# Patient Record
Sex: Male | Born: 1985
Health system: Southern US, Community
[De-identification: ages and names within clinical notes are randomized; demographics above are authoritative.]

## PROBLEM LIST (undated history)

## (undated) HISTORY — PX: OTHER SURGICAL HISTORY: SHX169

---

## 2019-12-27 DIAGNOSIS — Z03818 Encounter for observation for suspected exposure to other biological agents ruled out: Secondary | ICD-10-CM | POA: Diagnosis not present

## 2020-01-18 DIAGNOSIS — Z03818 Encounter for observation for suspected exposure to other biological agents ruled out: Secondary | ICD-10-CM | POA: Diagnosis not present

## 2020-01-26 ENCOUNTER — Ambulatory Visit
Admission: EM | Admit: 2020-01-26 | Discharge: 2020-01-26 | Disposition: A | Discharge status: Home / Self Care | Payer: BC Managed Care – PPO | Attending: Emergency Medicine | Admitting: Emergency Medicine

## 2020-01-26 ENCOUNTER — Other Ambulatory Visit: Payer: Self-pay

## 2020-01-26 DIAGNOSIS — M79671 Pain in right foot: Secondary | ICD-10-CM

## 2020-01-26 DIAGNOSIS — G5601 Carpal tunnel syndrome, right upper limb: Secondary | ICD-10-CM | POA: Diagnosis not present

## 2020-01-26 MED ORDER — PREDNISONE 10 MG (21) PO TBPK: ORAL_TABLET | Freq: Every day | ORAL | 0 refills | Status: DC

## 2020-01-26 NOTE — ED Triage Notes (Signed)
Pt states right wrist and right heel pain x 1 week and wrist pain for a longer time. Typing aggravates the wrist pain. Pt is aox4 and ambulatory.

## 2020-01-26 NOTE — Discharge Instructions (Addendum)
Heat therapy (hot compress, warm wash rag, hot showers, etc.) can help relax muscles and soothe muscle aches. Cold therapy (ice packs) can be used to help swelling both after injury and after prolonged use of areas of chronic pain/aches.  Pain medication:  500 mg Naprosyn/Aleve (naproxen) every 12 hours with food:  AVOID other NSAIDs while taking this (may have Tylenol).  Important to follow up with specialist(s) below for further evaluation/management if your symptoms persist or worsen. 

## 2020-01-26 NOTE — ED Provider Notes (Signed)
[facility]    CSN: [id number] Arrival date & time: 01/26/20  1633      History   Chief Complaint Chief Complaint  Patient presents with  . Foot Pain    since Friday  . Wrist Pain    Since Friday    HPI Jacob Mora is a 34 y.o. male  Presenting for multiple concerns. Endorsing right heel pain x1 week.  Denies injury, overuse.  No numbness or deformity.  Voicing concern for heel pain versus plantar fasciitis based on "Internet research ".  Has tried Tylenol without relief. Similarly, patient endorsing right wrist pain that has been chronic, intermittent.  Bugging him for the last few days.  No numbness, tingling.  Concern for carpal tunnel syndrome, though denies history thereof.  Does use right hand, types a lot which exacerbates this.   History reviewed. No pertinent past medical history.  There are no problems to display for this patient.   History reviewed. No pertinent surgical history.     Home Medications    Prior to Admission medications   Medication Sig Start Date End Date Taking? Authorizing Provider  predniSONE (STERAPRED UNI-PAK 21 TAB) 10 MG (21) TBPK tablet Take by mouth daily. Take steroid taper as written 01/26/20   Hall-Potvin, [place], PA-C    Family History History reviewed. No pertinent family history.  Social History Social History   Tobacco Use  . Smoking status: Never Smoker  . Smokeless tobacco: Never Used  Vaping Use  . Vaping Use: Never used  Substance Use Topics  . Alcohol use: Never  . Drug use: Never     Allergies   Patient has no allergy information on record.   Review of Systems As per HPI   Physical Exam Triage Vital Signs ED Triage Vitals  Enc Vitals Group     BP      Pulse      Resp      Temp      Temp src      SpO2      Weight      Height      Head Circumference      Peak Flow      Pain Score      Pain Loc      Pain Edu?      Excl. in GC?    No data found.  Updated Vital  Signs BP 108/72 (BP Location: Left Arm)   Pulse 75   Temp 97.8 F (36.6 C) (Oral)   Resp 20   SpO2 96%   Visual Acuity Right Eye Distance:   Left Eye Distance:   Bilateral Distance:    Right Eye Near:   Left Eye Near:    Bilateral Near:     Physical Exam Constitutional:      General: He is not in acute distress. HENT:     Head: Normocephalic and atraumatic.  Eyes:     General: No scleral icterus.    Pupils: Pupils are equal, round, and reactive to light.  Cardiovascular:     Rate and Rhythm: Normal rate.  Pulmonary:     Effort: Pulmonary effort is normal. No respiratory distress.     Breath sounds: No wheezing.  Musculoskeletal:     Comments: Right wrist not edematous as compared to left.  Mild tenderness diffusely throughout wrist.  No bony deformity.  Neurovascular intact.  Negative Tinel's, Phalen's, Finkelstein's test. Right heel with negative plantar tenderness.  Does have right heel  tenderness.  Negative Thompson test.  Skin:    Coloration: Skin is not jaundiced or pale.  Neurological:     Mental Status: He is alert and oriented to person, place, and time.      UC Treatments / Results  Labs (all labs ordered are listed, but only abnormal results are displayed) Labs Reviewed - No data to display  EKG   Radiology No results found.  Procedures Procedures (including critical care time)  Medications Ordered in UC Medications - No data to display  Initial Impression / Assessment and Plan / UC Course  I have reviewed the triage vital signs and the nursing notes.  Pertinent labs & imaging results that were available during my care of the patient were reviewed by me and considered in my medical decision making (see chart for details).     Likely heel spur and some component of carpal tunnel syndrome.  Provided Ace wrap, reviewed RICE protocol as below.  Provided specialty contact information for follow-up as needed.  Return precautions discussed, pt  verbalized understanding and is agreeable to plan. Final Clinical Impressions(s) / UC Diagnoses   Final diagnoses:  Carpal tunnel syndrome of right wrist  Pain of right heel     Discharge Instructions     Heat therapy (hot compress, warm wash rag, hot showers, etc.) can help relax muscles and soothe muscle aches. Cold therapy (ice packs) can be used to help swelling both after injury and after prolonged use of areas of chronic pain/aches.  Pain medication:  500 mg Naprosyn/Aleve (naproxen) every 12 hours with food:  AVOID other NSAIDs while taking this (may have Tylenol).  Important to follow up with specialist(s) below for further evaluation/management if your symptoms persist or worsen.    ED Prescriptions    Medication Sig Dispense Auth. Provider   predniSONE (STERAPRED UNI-PAK 21 TAB) 10 MG (21) TBPK tablet Take by mouth daily. Take steroid taper as written 21 tablet Hall-Potvin, [place], PA-C     PDMP not reviewed this encounter.   Hall-Potvin, [place], [place] 01/26/20 1738

## 2020-02-09 DIAGNOSIS — Z03818 Encounter for observation for suspected exposure to other biological agents ruled out: Secondary | ICD-10-CM | POA: Diagnosis not present

## 2020-03-14 DIAGNOSIS — Z03818 Encounter for observation for suspected exposure to other biological agents ruled out: Secondary | ICD-10-CM | POA: Diagnosis not present

## 2020-04-17 DIAGNOSIS — Z03818 Encounter for observation for suspected exposure to other biological agents ruled out: Secondary | ICD-10-CM | POA: Diagnosis not present

## 2020-04-19 DIAGNOSIS — Z20822 Contact with and (suspected) exposure to covid-19: Secondary | ICD-10-CM | POA: Diagnosis not present

## 2020-04-24 ENCOUNTER — Ambulatory Visit: Payer: BC Managed Care – PPO | Admitting: Family Medicine

## 2020-04-24 DIAGNOSIS — Z03818 Encounter for observation for suspected exposure to other biological agents ruled out: Secondary | ICD-10-CM | POA: Diagnosis not present

## 2020-04-28 DIAGNOSIS — Z23 Encounter for immunization: Secondary | ICD-10-CM | POA: Diagnosis not present

## 2020-05-13 DIAGNOSIS — G44209 Tension-type headache, unspecified, not intractable: Secondary | ICD-10-CM | POA: Diagnosis not present

## 2020-05-18 ENCOUNTER — Encounter: Payer: Self-pay | Admitting: Family Medicine

## 2020-05-18 ENCOUNTER — Ambulatory Visit (INDEPENDENT_AMBULATORY_CARE_PROVIDER_SITE_OTHER): Payer: BC Managed Care – PPO | Admitting: Family Medicine

## 2020-05-18 ENCOUNTER — Other Ambulatory Visit: Payer: Self-pay

## 2020-05-18 VITALS — BP 124/79 | HR 91 | Temp 98.7°F | Ht 68.0 in | Wt 186.4 lb

## 2020-05-18 DIAGNOSIS — N50811 Right testicular pain: Secondary | ICD-10-CM

## 2020-05-18 DIAGNOSIS — Z13220 Encounter for screening for lipoid disorders: Secondary | ICD-10-CM | POA: Diagnosis not present

## 2020-05-18 DIAGNOSIS — Z0001 Encounter for general adult medical examination with abnormal findings: Secondary | ICD-10-CM | POA: Diagnosis not present

## 2020-05-18 DIAGNOSIS — L659 Nonscarring hair loss, unspecified: Secondary | ICD-10-CM | POA: Diagnosis not present

## 2020-05-18 DIAGNOSIS — N50812 Left testicular pain: Secondary | ICD-10-CM | POA: Diagnosis not present

## 2020-05-18 NOTE — Assessment & Plan Note (Signed)
Stable on finasteride 1 mg daily.

## 2020-05-18 NOTE — Patient Instructions (Signed)
It was very nice to see you today!  We will check an ultrasound.  We will check blood work today.  I will see you back in year for your next physical.  Please come back to see me sooner if needed.  Take care, Dr Jimmey Ralph  Please try these tips to maintain a healthy lifestyle:   Eat at least 3 REAL meals and 1-2 snacks per day.  Aim for no more than 5 hours between eating.  If you eat breakfast, please do so within one hour of getting up.    Each meal should contain half fruits/vegetables, one quarter protein, and one quarter carbs (no bigger than a computer mouse)   Cut down on sweet beverages. This includes juice, soda, and sweet tea.     Drink at least 1 glass of water with each meal and aim for at least 8 glasses per day   Exercise at least 150 minutes every week.    Preventive Care 60-8 Years Old, Male Preventive care refers to lifestyle choices and visits with your health care provider that can promote health and wellness. This includes:  A yearly physical exam. This is also called an annual wellness visit.  Regular dental and eye exams.  Immunizations.  Screening for certain conditions.  Healthy lifestyle choices, such as: ? Eating a healthy diet. ? Getting regular exercise. ? Not using drugs or products that contain nicotine and tobacco. ? Limiting alcohol use. What can I expect for my preventive care visit? Physical exam Your health care provider may check your:  Height and weight. These may be used to calculate your BMI (body mass index). BMI is a measurement that tells if you are at a healthy weight.  Heart rate and blood pressure.  Body temperature.  Skin for abnormal spots. Counseling Your health care provider may ask you questions about your:  Past medical problems.  Family's medical history.  Alcohol, tobacco, and drug use.  Emotional well-being.  Home life and relationship well-being.  Sexual activity.  Diet, exercise, and sleep  habits.  Work and work Astronomer.  Access to firearms. What immunizations do I need? Vaccines are usually given at various ages, according to a schedule. Your health care provider will recommend vaccines for you based on your age, medical history, and lifestyle or other factors, such as travel or where you work.   What tests do I need? Blood tests  Lipid and cholesterol levels. These may be checked every 5 years starting at age 12.  Hepatitis C test.  Hepatitis B test. Screening  Diabetes screening. This is done by checking your blood sugar (glucose) after you have not eaten for a while (fasting).  Genital exam to check for testicular cancer or hernias.  STD (sexually transmitted disease) testing, if you are at risk. Talk with your health care provider about your test results, treatment options, and if necessary, the need for more tests.   Follow these instructions at home: Eating and drinking  Eat a healthy diet that includes fresh fruits and vegetables, whole grains, lean protein, and low-fat dairy products.  Drink enough fluid to keep your urine pale yellow.  Take vitamin and mineral supplements as recommended by your health care provider.  Do not drink alcohol if your health care provider tells you not to drink.  If you drink alcohol: ? Limit how much you have to 0-2 drinks a day. ? Be aware of how much alcohol is in your drink. In the [place] one drink  equals one 12 oz bottle of beer (355 mL), one 5 oz glass of wine (148 mL), or one 1 oz glass of hard liquor (44 mL).   Lifestyle  Take daily care of your teeth and gums. Brush your teeth every morning and night with fluoride toothpaste. Floss one time each day.  Stay active. Exercise for at least 30 minutes 5 or more days each week.  Do not use any products that contain nicotine or tobacco, such as cigarettes, e-cigarettes, and chewing tobacco. If you need help quitting, ask your health care provider.  Do not use  drugs.  If you are sexually active, practice safe sex. Use a condom or other form of protection to prevent STIs (sexually transmitted infections).  Find healthy ways to cope with stress, such as: ? Meditation, yoga, or listening to music. ? Journaling. ? Talking to a trusted person. ? Spending time with friends and family. Safety  Always wear your seat belt while driving or riding in a vehicle.  Do not drive: ? If you have been drinking alcohol. Do not ride with someone who has been drinking. ? When you are tired or distracted. ? While texting.  Wear a helmet and other protective equipment during sports activities.  If you have firearms in your house, make sure you follow all gun safety procedures.  Seek help if you have been physically or sexually abused. What's next?  Go to your health care provider once a year for an annual wellness visit.  Ask your health care provider how often you should have your eyes and teeth checked.  Stay up to date on all vaccines. This information is not intended to replace advice given to you by your health care provider. Make sure you discuss any questions you have with your health care provider. Document Revised: 12/16/2018 Document Reviewed: 03/26/2018 Elsevier Patient Education  2021 [employer].

## 2020-05-18 NOTE — Assessment & Plan Note (Addendum)
Patient with testicular pain for last couple of years.  Had an ultrasound a couple of years ago but said he was "gummed up".  He does not have the study available with him.  Symptoms have persisted.  Will repeat ultrasound.  May need referral back to urology depending on results of ultrasound.

## 2020-05-18 NOTE — Progress Notes (Signed)
Chief Complaint:  Jacob Mora is a 35 y.o. male who presents today for his annual comprehensive physical exam.  He is a new patient.   Assessment/Plan:  Chronic Problems Addressed Today: Pain in both testicles Patient with testicular pain for last couple of years.  Had an ultrasound a couple of years ago but said he was "gummed up".  He does not have the study available with him.  Symptoms have persisted.  Will repeat ultrasound.  May need referral back to urology depending on results of ultrasound.  Alopecia Stable on finasteride 1 mg daily.  Body mass index is 28.34 kg/m. / Overweight  BMI Metric Follow Up - 05/18/20 1526      BMI Metric Follow Up-Please document annually   BMI Metric Follow Up Education provided           Preventative Healthcare: Check labs.  Up-to-date on vaccines.  Patient Counseling(The following topics were reviewed and/or handout was given):  -Nutrition: Stressed importance of moderation in sodium/caffeine intake, saturated fat and cholesterol, caloric balance, sufficient intake of fresh fruits, vegetables, and fiber.  -Stressed the importance of regular exercise.   -Substance Abuse: Discussed cessation/primary prevention of tobacco, alcohol, or other drug use; driving or other dangerous activities under the influence; availability of treatment for abuse.   -Injury prevention: Discussed safety belts, safety helmets, smoke detector, smoking near bedding or upholstery.   -Sexuality: Discussed sexually transmitted diseases, partner selection, use of condoms, avoidance of unintended pregnancy and contraceptive alternatives.   -Dental health: Discussed importance of regular tooth brushing, flossing, and dental visits.  -Health maintenance and immunizations reviewed. Please refer to Health maintenance section.  Return to care in 1 year for next preventative visit.     Subjective:  HPI:  He has no acute complaints today.    Lifestyle Diet: Plenty of  fruits and vegetables.  Exercise: Likes soccer.   Depression screen PHQ 2/9 05/18/2020  Decreased Interest 0  Down, Depressed, Hopeless 0  PHQ - 2 Score 0    Health Maintenance Due  Topic Date Due  . Hepatitis C Screening  Never done  . HIV Screening  Never done  . TETANUS/TDAP  Never done     ROS: Per HPI, otherwise a complete review of systems was negative.   PMH:  The following were reviewed and entered/updated in epic: No past medical history on file. Patient Active Problem List   Diagnosis Date Noted  . Alopecia 05/18/2020  . Pain in both testicles 05/18/2020   No past surgical history on file.  No family history on file.  Medications- reviewed and updated Current Outpatient Medications  Medication Sig Dispense Refill  . finasteride (PROPECIA) 1 MG tablet Take 1 mg by mouth daily.    . cyclobenzaprine (FEXMID) 7.5 MG tablet      No current facility-administered medications for this visit.    Allergies-reviewed and updated No Known Allergies  Social History   Socioeconomic History  . Marital status: Single    Spouse name: Not on file  . Number of children: Not on file  . Years of education: Not on file  . Highest education level: Not on file  Occupational History  . Not on file  Tobacco Use  . Smoking status: Never Smoker  . Smokeless tobacco: Never Used  Vaping Use  . Vaping Use: Never used  Substance and Sexual Activity  . Alcohol use: Never  . Drug use: Never  . Sexual activity: Yes  Other Topics Concern  .  Not on file  Social History Narrative  . Not on file   Social Determinants of Health   Financial Resource Strain: Not on file  Food Insecurity: Not on file  Transportation Needs: Not on file  Physical Activity: Not on file  Stress: Not on file  Social Connections: Not on file        Objective:  Physical Exam: BP 124/79   Pulse 91   Temp 98.7 F (37.1 C) (Temporal)   Ht 5\' 8"  (1.727 m)   Wt 186 lb 6.4 oz (84.6 kg)   SpO2 96%    BMI 28.34 kg/m   Body mass index is 28.34 kg/m. Wt Readings from Last 3 Encounters:  05/18/20 186 lb 6.4 oz (84.6 kg)   Gen: NAD, resting comfortably HEENT: TMs normal bilaterally. OP clear. No thyromegaly noted.  CV: RRR with no murmurs appreciated Pulm: NWOB, CTAB with no crackles, wheezes, or rhonchi GI: Normal bowel sounds present. Soft, Nontender, Nondistended. MSK: no edema, cyanosis, or clubbing noted GU: Normal male genitalia.  No hernias appreciated.  No abnormal lumps or bumps. Skin: warm, dry Neuro: CN2-12 grossly intact. Strength 5/5 in upper and lower extremities. Reflexes symmetric and intact bilaterally.  Psych: Normal affect and thought content     Shoji Pertuit M. 07/16/20, MD 05/18/2020 3:26 PM

## 2020-05-19 LAB — COMPREHENSIVE METABOLIC PANEL
ALT: 52 U/L (ref 0–53)
AST: 27 U/L (ref 0–37)
Albumin: 4.9 g/dL (ref 3.5–5.2)
Alkaline Phosphatase: 76 U/L (ref 39–117)
BUN: 12 mg/dL (ref 6–23)
CO2: 28 mEq/L (ref 19–32)
Calcium: 9.7 mg/dL (ref 8.4–10.5)
Chloride: 102 mEq/L (ref 96–112)
Creatinine, Ser: 0.86 mg/dL (ref 0.40–1.50)
GFR: 112.06 mL/min (ref >60.00)
Glucose, Bld: 139 mg/dL — ABNORMAL HIGH (ref 70–99)
Potassium: 3.9 mEq/L (ref 3.5–5.1)
Sodium: 138 mEq/L (ref 135–145)
Total Bilirubin: 0.6 mg/dL (ref 0.2–1.2)
Total Protein: 7.7 g/dL (ref 6.0–8.3)

## 2020-05-19 LAB — LIPID PANEL
Cholesterol: 183 mg/dL (ref 0–200)
HDL: 35 mg/dL — ABNORMAL LOW (ref >39.00)
Total CHOL/HDL Ratio: 5
Triglycerides: 703 mg/dL — ABNORMAL HIGH (ref 0.0–149.0)

## 2020-05-19 LAB — CBC
HCT: 45.9 % (ref 39.0–52.0)
Hemoglobin: 16.5 g/dL (ref 13.0–17.0)
MCHC: 36 g/dL (ref 30.0–36.0)
MCV: 85.8 fl (ref 78.0–100.0)
Platelets: 285 10*3/uL (ref 150.0–400.0)
RBC: 5.35 Mil/uL (ref 4.22–5.81)
RDW: 12.9 % (ref 11.5–15.5)
WBC: 5.9 10*3/uL (ref 4.0–10.5)

## 2020-05-19 LAB — TSH: TSH: 0.58 u[IU]/mL (ref 0.35–4.50)

## 2020-05-19 LAB — LDL CHOLESTEROL, DIRECT: Direct LDL: 100 mg/dL

## 2020-05-19 NOTE — Progress Notes (Signed)
Please inform patient of the following:  His blood sugar and triglycerides were elevated but not sure if he was fasting. Would like for him to come back for fasting labs. Please place future order for glucose, A1c, and lipid panel.  Everything else was NORMAL.  Katina Degree. Jimmey Ralph, MD 05/19/2020 4:25 PM

## 2020-05-26 ENCOUNTER — Other Ambulatory Visit: Payer: Self-pay | Admitting: *Deleted

## 2020-05-26 DIAGNOSIS — R7309 Other abnormal glucose: Secondary | ICD-10-CM

## 2020-05-30 ENCOUNTER — Other Ambulatory Visit: Payer: Self-pay

## 2020-05-30 ENCOUNTER — Other Ambulatory Visit (INDEPENDENT_AMBULATORY_CARE_PROVIDER_SITE_OTHER): Payer: BC Managed Care – PPO

## 2020-05-30 DIAGNOSIS — R7309 Other abnormal glucose: Secondary | ICD-10-CM

## 2020-05-30 LAB — LIPID PANEL
Cholesterol: 166 mg/dL (ref 0–200)
HDL: 41.4 mg/dL (ref >39.00)
LDL Cholesterol: 95 mg/dL (ref 0–99)
NonHDL: 125.07
Total CHOL/HDL Ratio: 4
Triglycerides: 152 mg/dL — ABNORMAL HIGH (ref 0.0–149.0)
VLDL: 30.4 mg/dL (ref 0.0–40.0)

## 2020-05-30 LAB — HEMOGLOBIN A1C: Hgb A1c MFr Bld: 5.6 % (ref 4.6–6.5)

## 2020-05-30 NOTE — Progress Notes (Signed)
Please inform patient of the following:  Triglycerides back to normal and his A1c is normal. Do not need to make any changes to treatment plan. Would like for him to keep working on diet and exercise and we can recheck in a year.  Katina Degree. Jimmey Ralph, MD 05/30/2020 1:02 PM

## 2020-06-05 ENCOUNTER — Telehealth: Payer: Self-pay | Admitting: *Deleted

## 2020-06-05 NOTE — Telephone Encounter (Signed)
Patient requesting urology referral  Stated still having problems

## 2020-06-06 ENCOUNTER — Other Ambulatory Visit: Payer: Self-pay | Admitting: *Deleted

## 2020-06-06 DIAGNOSIS — N50812 Left testicular pain: Secondary | ICD-10-CM

## 2020-06-06 NOTE — Telephone Encounter (Signed)
Referral placed.

## 2020-06-06 NOTE — Telephone Encounter (Signed)
Ok with me. Please place any necessary orders. 

## 2020-06-26 DIAGNOSIS — Z03818 Encounter for observation for suspected exposure to other biological agents ruled out: Secondary | ICD-10-CM | POA: Diagnosis not present

## 2020-06-30 DIAGNOSIS — R350 Frequency of micturition: Secondary | ICD-10-CM | POA: Diagnosis not present

## 2020-06-30 DIAGNOSIS — R102 Pelvic and perineal pain: Secondary | ICD-10-CM | POA: Diagnosis not present

## 2020-06-30 DIAGNOSIS — N50811 Right testicular pain: Secondary | ICD-10-CM | POA: Diagnosis not present

## 2020-07-03 ENCOUNTER — Other Ambulatory Visit: Payer: Self-pay | Admitting: Urology

## 2020-07-03 DIAGNOSIS — N50811 Right testicular pain: Secondary | ICD-10-CM

## 2020-07-14 ENCOUNTER — Ambulatory Visit
Admission: RE | Admit: 2020-07-14 | Discharge: 2020-07-14 | Disposition: A | Discharge status: Home / Self Care | Payer: BC Managed Care – PPO | Source: Ambulatory Visit | Attending: Urology | Admitting: Urology

## 2020-07-14 DIAGNOSIS — N50811 Right testicular pain: Secondary | ICD-10-CM

## 2020-07-14 DIAGNOSIS — N503 Cyst of epididymis: Secondary | ICD-10-CM | POA: Diagnosis not present

## 2020-07-17 DIAGNOSIS — M6289 Other specified disorders of muscle: Secondary | ICD-10-CM | POA: Diagnosis not present

## 2020-07-17 DIAGNOSIS — M62838 Other muscle spasm: Secondary | ICD-10-CM | POA: Diagnosis not present

## 2020-07-17 DIAGNOSIS — M6281 Muscle weakness (generalized): Secondary | ICD-10-CM | POA: Diagnosis not present

## 2020-07-17 DIAGNOSIS — R102 Pelvic and perineal pain: Secondary | ICD-10-CM | POA: Diagnosis not present

## 2020-07-24 DIAGNOSIS — Z03818 Encounter for observation for suspected exposure to other biological agents ruled out: Secondary | ICD-10-CM | POA: Diagnosis not present

## 2020-07-31 DIAGNOSIS — R350 Frequency of micturition: Secondary | ICD-10-CM | POA: Diagnosis not present

## 2020-07-31 DIAGNOSIS — M6281 Muscle weakness (generalized): Secondary | ICD-10-CM | POA: Diagnosis not present

## 2020-07-31 DIAGNOSIS — R102 Pelvic and perineal pain: Secondary | ICD-10-CM | POA: Diagnosis not present

## 2020-07-31 DIAGNOSIS — N50811 Right testicular pain: Secondary | ICD-10-CM | POA: Diagnosis not present

## 2020-08-21 DIAGNOSIS — M6281 Muscle weakness (generalized): Secondary | ICD-10-CM | POA: Diagnosis not present

## 2020-08-21 DIAGNOSIS — R350 Frequency of micturition: Secondary | ICD-10-CM | POA: Diagnosis not present

## 2020-08-21 DIAGNOSIS — R102 Pelvic and perineal pain: Secondary | ICD-10-CM | POA: Diagnosis not present

## 2020-08-21 DIAGNOSIS — N50811 Right testicular pain: Secondary | ICD-10-CM | POA: Diagnosis not present

## 2020-10-02 DIAGNOSIS — N50811 Right testicular pain: Secondary | ICD-10-CM | POA: Diagnosis not present

## 2020-10-02 DIAGNOSIS — R102 Pelvic and perineal pain: Secondary | ICD-10-CM | POA: Diagnosis not present

## 2020-10-10 DIAGNOSIS — R350 Frequency of micturition: Secondary | ICD-10-CM | POA: Diagnosis not present

## 2020-10-10 DIAGNOSIS — R102 Pelvic and perineal pain: Secondary | ICD-10-CM | POA: Diagnosis not present

## 2020-10-10 DIAGNOSIS — N50811 Right testicular pain: Secondary | ICD-10-CM | POA: Diagnosis not present

## 2020-10-10 DIAGNOSIS — M6281 Muscle weakness (generalized): Secondary | ICD-10-CM | POA: Diagnosis not present

## 2020-10-30 DIAGNOSIS — M79604 Pain in right leg: Secondary | ICD-10-CM | POA: Diagnosis not present

## 2020-11-09 DIAGNOSIS — M79604 Pain in right leg: Secondary | ICD-10-CM | POA: Diagnosis not present

## 2021-09-14 IMAGING — US US SCROTUM W/ DOPPLER COMPLETE
1 series · 13 of 25 positions shown · non-contrast
Comparison: None available.

CLINICAL DATA: Initial evaluation for right testicular tenderness
for 2 years.

EXAM:
SCROTAL ULTRASOUND
DOPPLER ULTRASOUND OF THE TESTICLES
TECHNIQUE: Complete ultrasound examination of the testicles, epididymis, and
other scrotal structures was performed. Color and spectral Doppler
ultrasound were also utilized to evaluate blood flow to the
testicles.

[Series 1: us scrotum w/ doppler complete · 0.08mm/px · 13 of 64 slices shown]
[im 1/64]
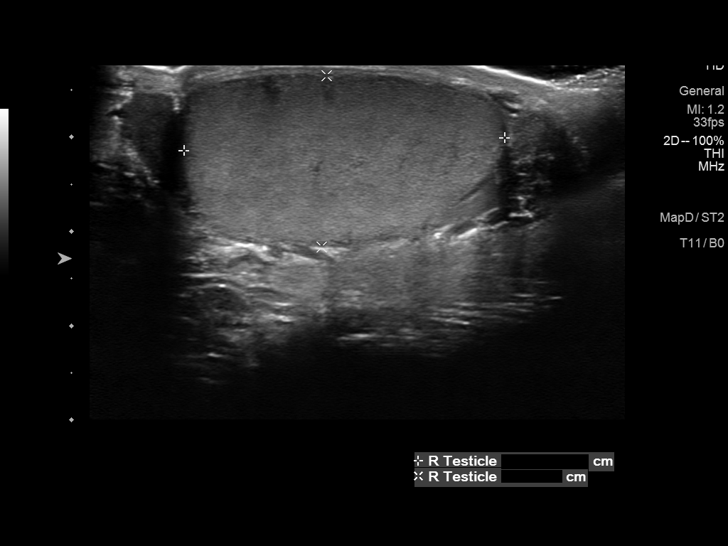
[im 6/64]
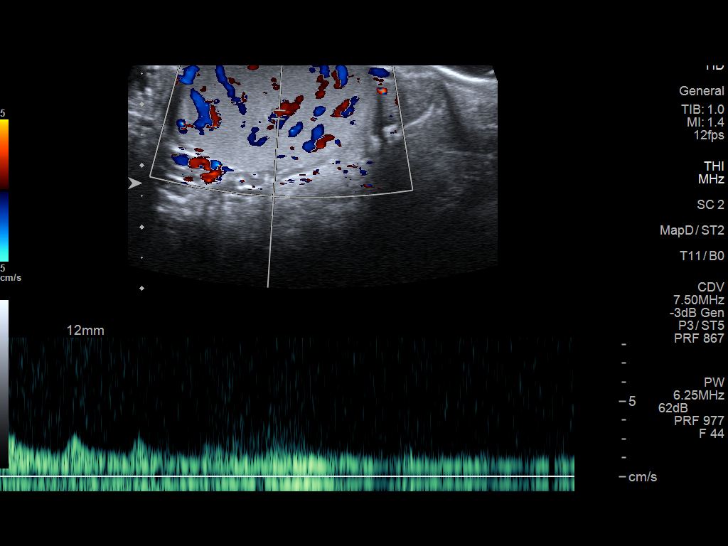
[im 11/64]
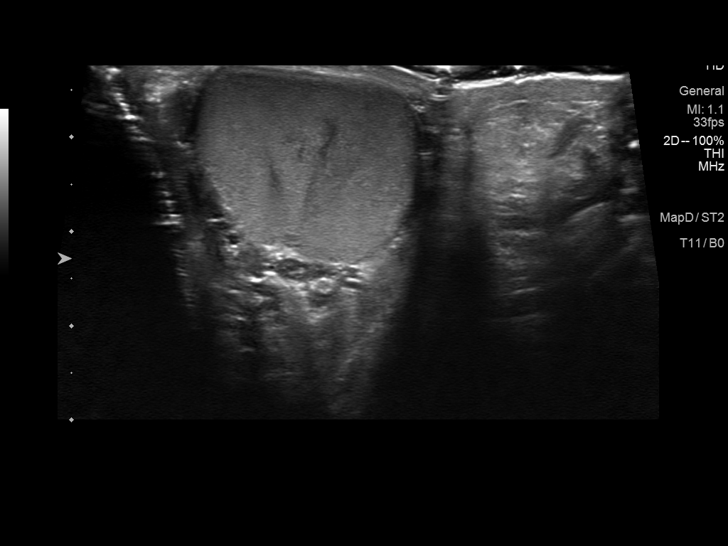
[im 16/64]
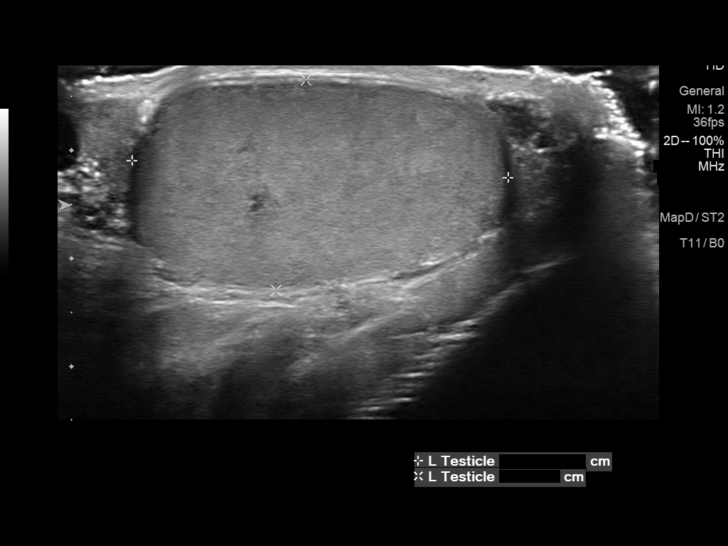
[im 22/64]
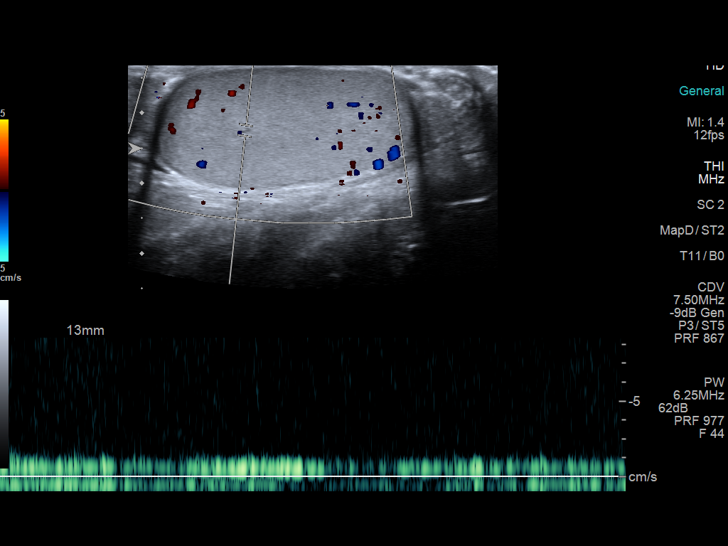
[im 27/64]
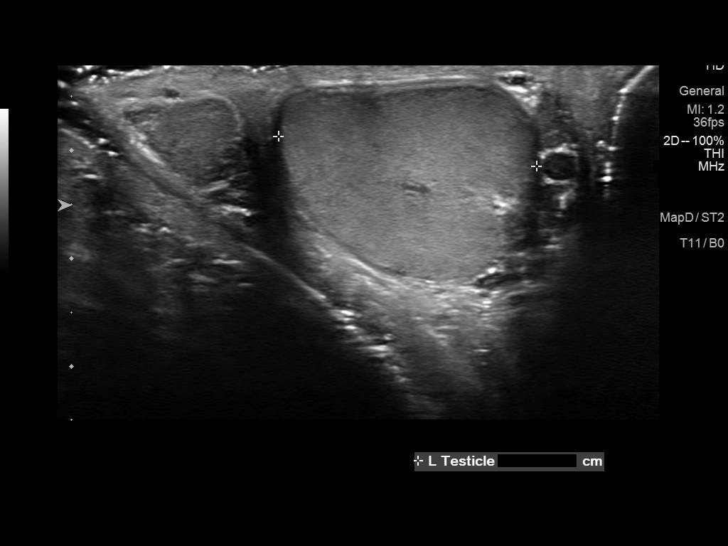
[im 32/64]
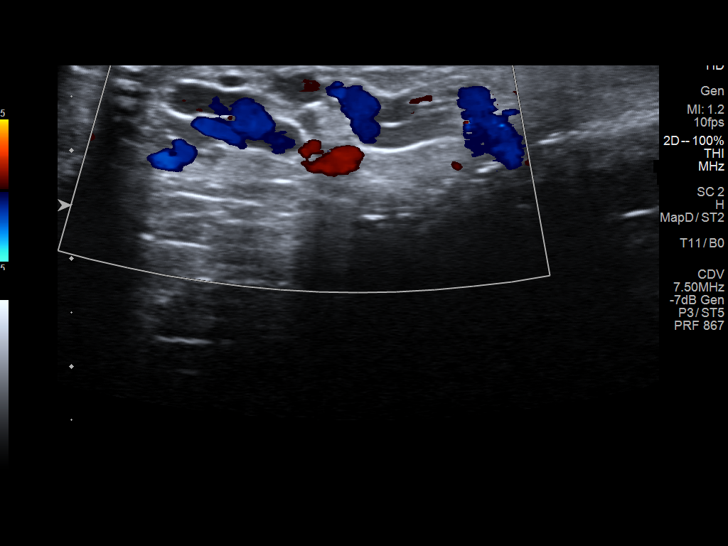
[im 37/64]
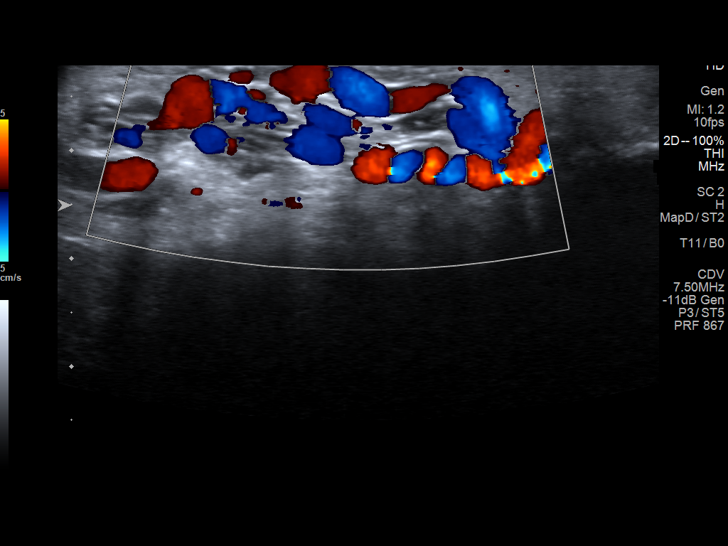
[im 43/64]
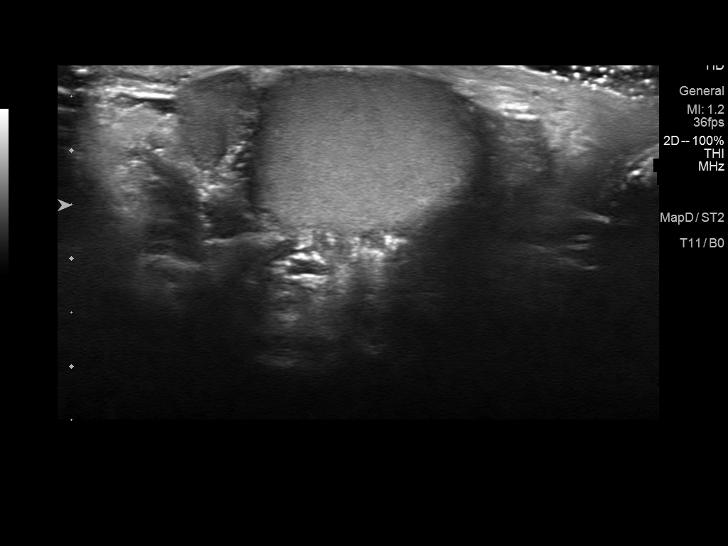
[im 48/64]
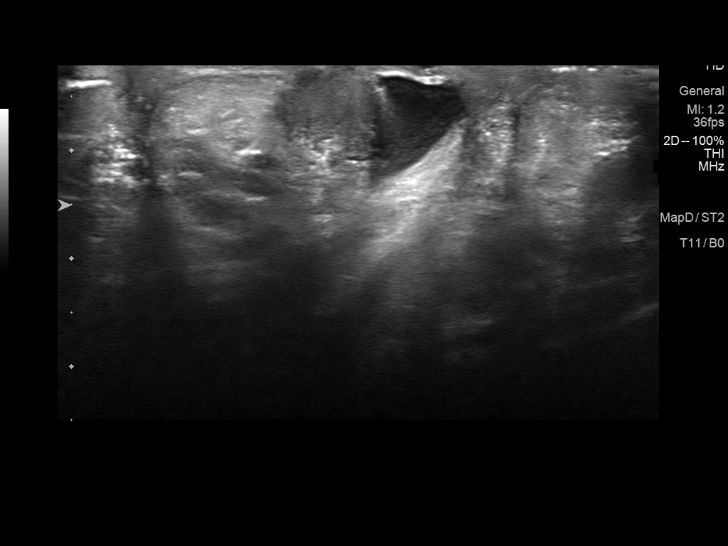
[im 53/64]
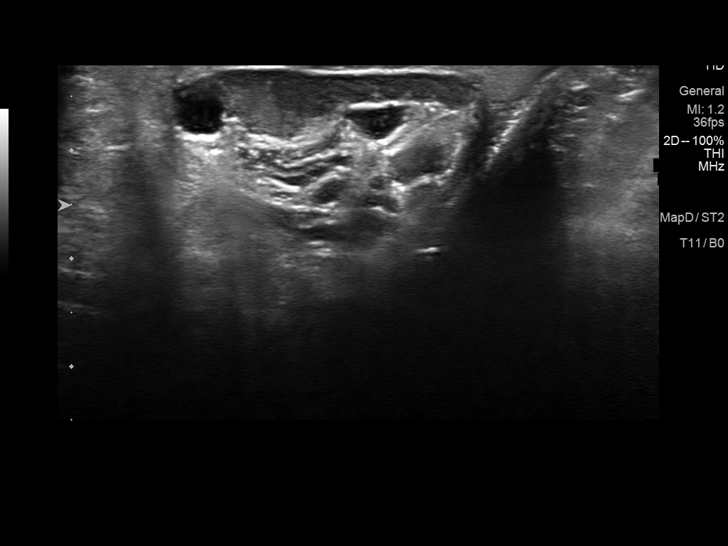
[im 58/64]
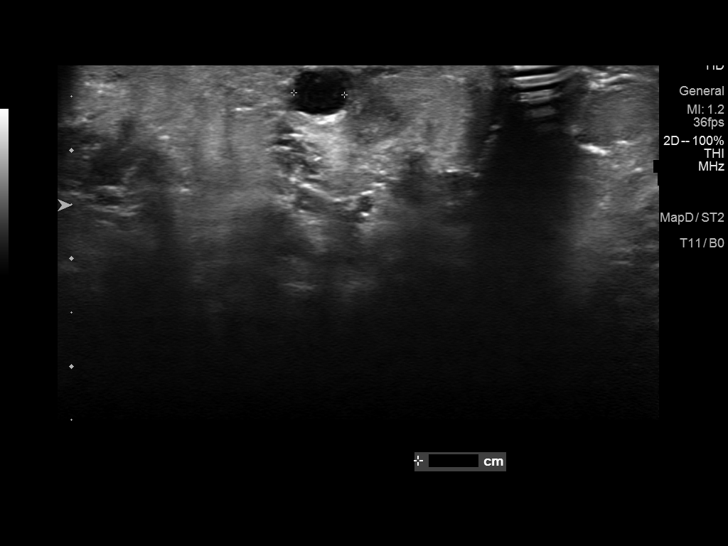
[im 64/64]
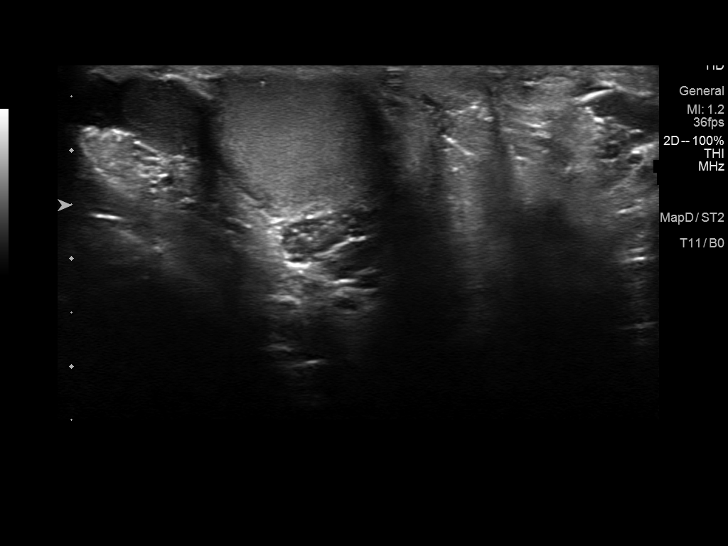

[13 of 25 positions shown; findings below may reference images not displayed]

FINDINGS: Right testicle

Measurements: 3.4 x 1.8 x 2.4 cm. No mass or microlithiasis
visualized.

Left testicle

Measurements: 3.5 x 2.0 x 2.4 cm. No mass or microlithiasis
visualized.

Right epididymis:  Normal in size and appearance.

Left epididymis: Normal in size and appearance. 6 mm mildly complex
septated cyst at the left epididymal head, most consistent with a
benign spermatocele/epididymal cyst.

Hydrocele:  None visualized.

Varicocele:  Probable small left-sided varicocele.

Pulsed Doppler interrogation of both testes demonstrates normal low
resistance arterial and venous waveforms bilaterally.
IMPRESSION: 1. Probable small left-sided varicocele.
2. 6 mm mildly complex septated cyst at the left epididymal head,
most consistent with a benign spermatocele/epididymal cyst. Finding
felt to be incidental in nature, and of doubtful clinical
significance.
3. Otherwise unremarkable and normal scrotal ultrasound. No
right-sided findings to explain patient's symptoms identified.
